# Patient Record
Sex: Female | Born: 1975 | Race: White | Hispanic: No | Marital: Single
Health system: Southern US, Community
[De-identification: ages and names within clinical notes are randomized; demographics above are authoritative.]

---

## 2001-03-24 ENCOUNTER — Other Ambulatory Visit: Admission: RE | Admit: 2001-03-24 | Discharge: 2001-03-24 | Payer: Self-pay | Admitting: Obstetrics and Gynecology

## 2002-04-01 ENCOUNTER — Other Ambulatory Visit: Admission: RE | Admit: 2002-04-01 | Discharge: 2002-04-01 | Payer: Self-pay | Admitting: Obstetrics and Gynecology

## 2003-02-01 ENCOUNTER — Inpatient Hospital Stay (HOSPITAL_COMMUNITY): Admission: RE | Admit: 2003-02-01 | Discharge: 2003-02-04 | Payer: Self-pay | Admitting: Obstetrics and Gynecology

## 2003-03-10 ENCOUNTER — Other Ambulatory Visit: Admission: RE | Admit: 2003-03-10 | Discharge: 2003-03-10 | Payer: Self-pay | Admitting: Obstetrics and Gynecology

## 2004-04-11 ENCOUNTER — Other Ambulatory Visit: Admission: RE | Admit: 2004-04-11 | Discharge: 2004-04-11 | Payer: Self-pay | Admitting: Obstetrics and Gynecology

## 2015-05-04 ENCOUNTER — Other Ambulatory Visit: Payer: Self-pay | Admitting: Family Medicine

## 2015-05-04 DIAGNOSIS — Z1239 Encounter for other screening for malignant neoplasm of breast: Secondary | ICD-10-CM

## 2015-06-08 ENCOUNTER — Ambulatory Visit (INDEPENDENT_AMBULATORY_CARE_PROVIDER_SITE_OTHER): Payer: Managed Care, Other (non HMO)

## 2015-06-08 DIAGNOSIS — Z1231 Encounter for screening mammogram for malignant neoplasm of breast: Secondary | ICD-10-CM | POA: Diagnosis not present

## 2015-06-08 DIAGNOSIS — Z1239 Encounter for other screening for malignant neoplasm of breast: Secondary | ICD-10-CM

## 2015-06-09 ENCOUNTER — Other Ambulatory Visit: Payer: Self-pay | Admitting: Family Medicine

## 2015-06-09 DIAGNOSIS — R928 Other abnormal and inconclusive findings on diagnostic imaging of breast: Secondary | ICD-10-CM

## 2015-06-13 ENCOUNTER — Telehealth: Payer: Self-pay

## 2015-06-14 ENCOUNTER — Other Ambulatory Visit: Payer: Self-pay | Admitting: Family Medicine

## 2015-06-14 DIAGNOSIS — R928 Other abnormal and inconclusive findings on diagnostic imaging of breast: Secondary | ICD-10-CM

## 2015-06-16 ENCOUNTER — Ambulatory Visit
Admission: RE | Admit: 2015-06-16 | Discharge: 2015-06-16 | Disposition: A | Discharge status: Home / Self Care | Payer: Managed Care, Other (non HMO) | Source: Ambulatory Visit | Attending: Family Medicine | Admitting: Family Medicine

## 2015-06-16 DIAGNOSIS — R928 Other abnormal and inconclusive findings on diagnostic imaging of breast: Secondary | ICD-10-CM

## 2016-08-16 NOTE — Telephone Encounter (Signed)
Error

## 2017-10-09 IMAGING — MG MM DIAG BREAST TOMO UNI RIGHT
6 of 10 series · 6 of 26 positions shown · non-contrast
Comparison: Baseline screening mammogram dated 06/08/2015.

CLINICAL DATA: Screening recall from baseline mammogram for a right
breast asymmetry.

EXAM:
2D DIGITAL DIAGNOSTIC UNILATERAL RIGHT MAMMOGRAM WITH CAD AND
ADJUNCT TOMO
RIGHT BREAST ULTRASOUND

[R MLO synth-2D]
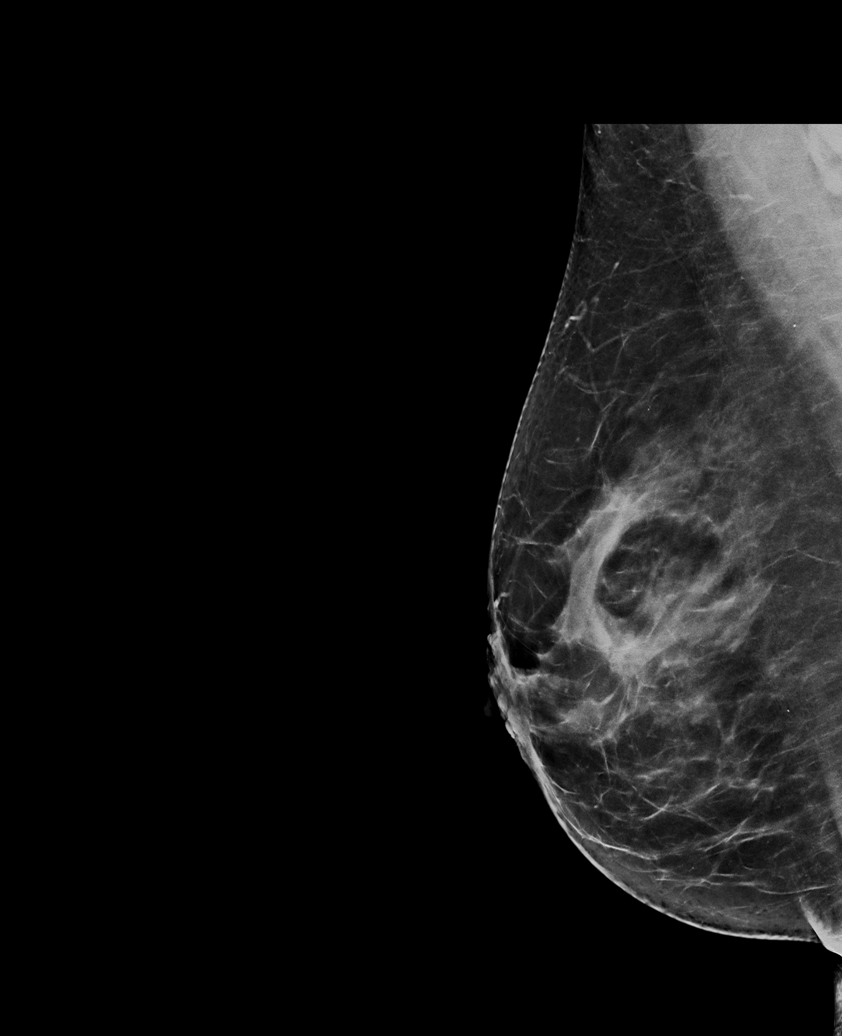

[R CC synth-2D]
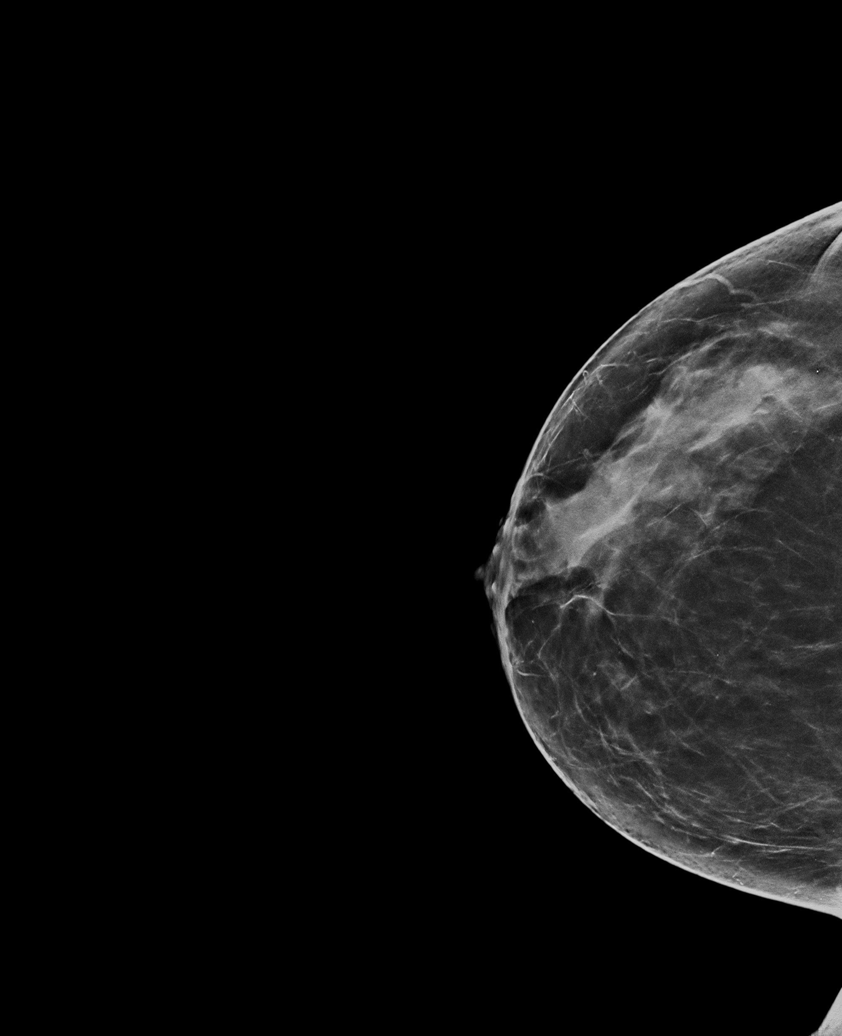

[R MLO (1 of 2)]
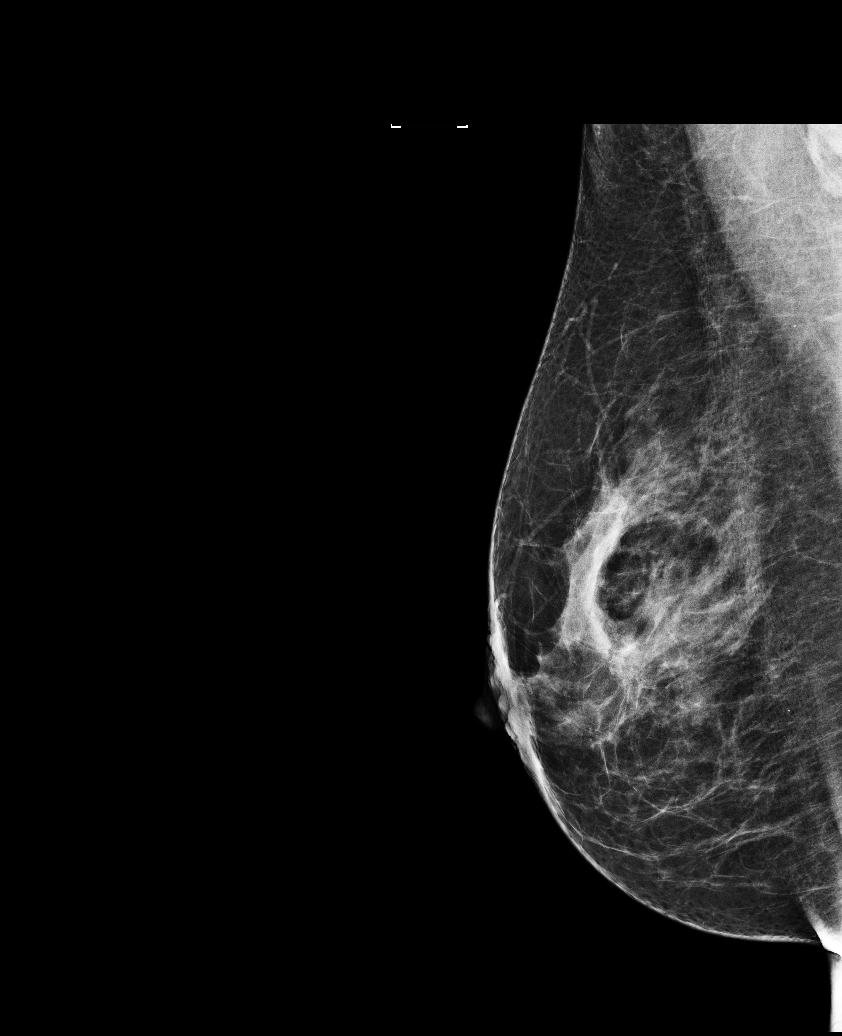

[R CC (1 of 2)]
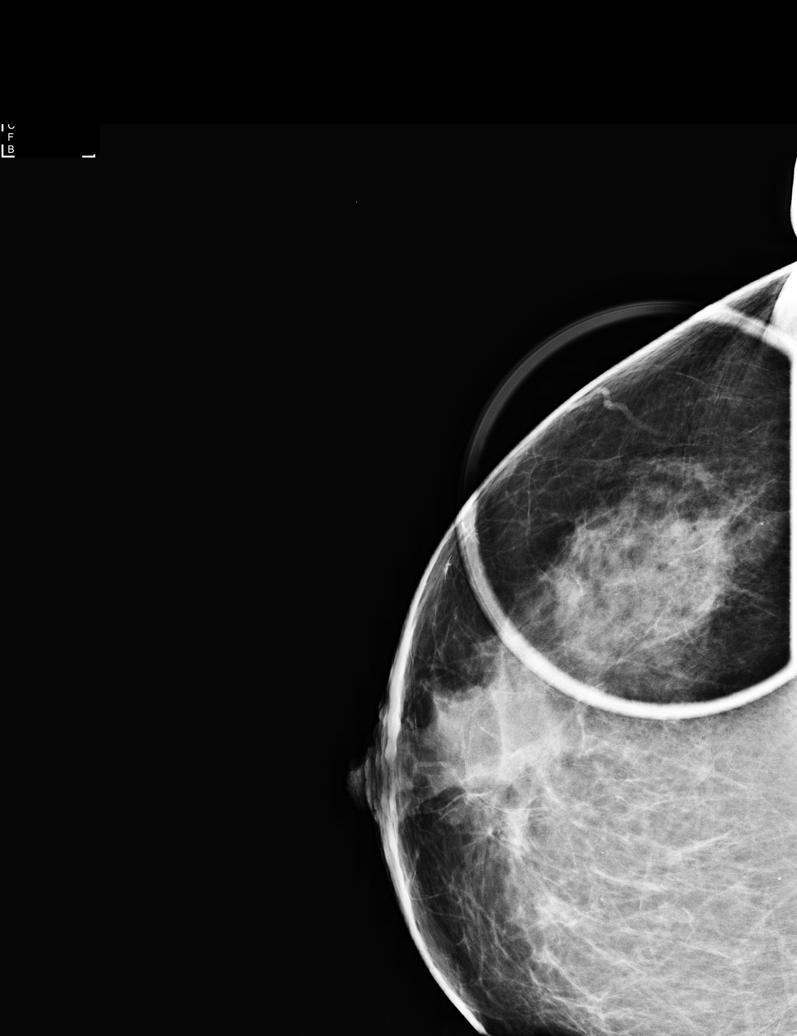

[R MLO (2 of 2)]
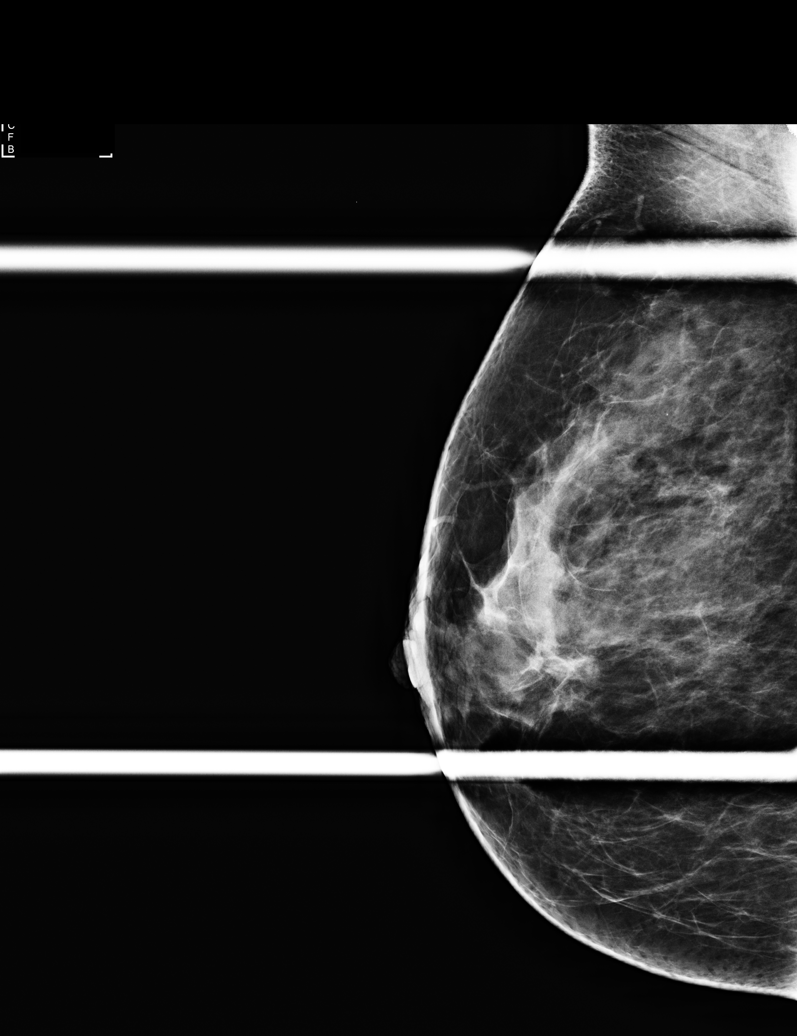

[R CC (2 of 2)]
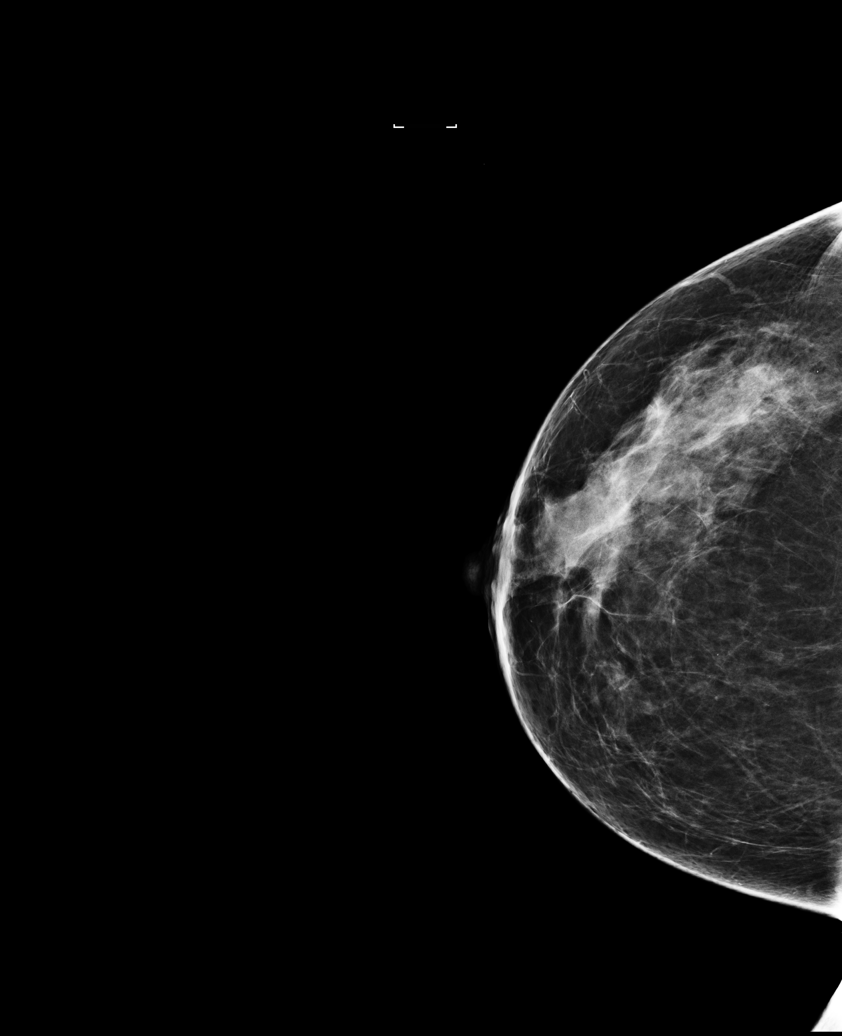

[6 of 26 positions shown; findings below may reference images not displayed]

ACR Breast Density Category c: The breast tissue is heterogeneously
dense, which may obscure small masses.
FINDINGS: Additional spot compression tomosynthesis images of the right breast
demonstrate no persistent asymmetry or underlying mass in the area
of concern identified on the screening mammogram. No suspicious
calcifications, masses or areas of distortion are seen in the right
breast. However, due to the density of the breast tissue in this
area, ultrasound will be performed for further evaluation.

Mammographic images were processed with CAD.

Physical exam of the upper-outer quadrant of the right breast
demonstrates no suspicious palpable masses.

Ultrasound of the lateral and upper outer right breast which was
scanned from the 8 o'clock to the 12 o'clock position demonstrates
no masses or suspicious areas of shadowing. Normal fibroglandular
tissue is identified.
IMPRESSION: 1. Interval resolution of the asymmetry of concern in the right
breast. This is consistent with overlapping fibroglandular tissue.

2.  No mammographic evidence of right breast malignancy.

RECOMMENDATION:
Screening mammogram in one year.(Code:[id number])

I have discussed the findings and recommendations with the patient.
Results were also provided in writing at the conclusion of the
visit. If applicable, a reminder letter will be sent to the patient
regarding the next appointment.

BI-RADS CATEGORY  1: Negative.

## 2020-05-27 DIAGNOSIS — Z1231 Encounter for screening mammogram for malignant neoplasm of breast: Secondary | ICD-10-CM | POA: Diagnosis not present

## 2020-05-27 DIAGNOSIS — Z7689 Persons encountering health services in other specified circumstances: Secondary | ICD-10-CM | POA: Diagnosis not present

## 2020-09-23 DIAGNOSIS — Z131 Encounter for screening for diabetes mellitus: Secondary | ICD-10-CM | POA: Diagnosis not present

## 2020-09-23 DIAGNOSIS — Z13220 Encounter for screening for lipoid disorders: Secondary | ICD-10-CM | POA: Diagnosis not present

## 2020-09-23 DIAGNOSIS — Z23 Encounter for immunization: Secondary | ICD-10-CM | POA: Diagnosis not present

## 2020-09-23 DIAGNOSIS — Z0000 Encounter for general adult medical examination without abnormal findings: Secondary | ICD-10-CM | POA: Diagnosis not present

## 2021-01-03 DIAGNOSIS — Z6833 Body mass index (BMI) 33.0-33.9, adult: Secondary | ICD-10-CM | POA: Diagnosis not present

## 2021-01-03 DIAGNOSIS — Z01419 Encounter for gynecological examination (general) (routine) without abnormal findings: Secondary | ICD-10-CM | POA: Diagnosis not present

## 2021-01-06 DIAGNOSIS — D2261 Melanocytic nevi of right upper limb, including shoulder: Secondary | ICD-10-CM | POA: Diagnosis not present

## 2021-01-06 DIAGNOSIS — D224 Melanocytic nevi of scalp and neck: Secondary | ICD-10-CM | POA: Diagnosis not present

## 2021-01-06 DIAGNOSIS — L821 Other seborrheic keratosis: Secondary | ICD-10-CM | POA: Diagnosis not present

## 2021-01-06 DIAGNOSIS — D2262 Melanocytic nevi of left upper limb, including shoulder: Secondary | ICD-10-CM | POA: Diagnosis not present

## 2021-03-21 DIAGNOSIS — Z1211 Encounter for screening for malignant neoplasm of colon: Secondary | ICD-10-CM | POA: Diagnosis not present

## 2021-03-21 DIAGNOSIS — D120 Benign neoplasm of cecum: Secondary | ICD-10-CM | POA: Diagnosis not present

## 2021-03-21 DIAGNOSIS — K648 Other hemorrhoids: Secondary | ICD-10-CM | POA: Diagnosis not present

## 2021-06-20 DIAGNOSIS — Z1231 Encounter for screening mammogram for malignant neoplasm of breast: Secondary | ICD-10-CM | POA: Diagnosis not present

## 2021-10-04 DIAGNOSIS — Z131 Encounter for screening for diabetes mellitus: Secondary | ICD-10-CM | POA: Diagnosis not present

## 2021-10-04 DIAGNOSIS — E785 Hyperlipidemia, unspecified: Secondary | ICD-10-CM | POA: Diagnosis not present

## 2021-10-04 DIAGNOSIS — Z0000 Encounter for general adult medical examination without abnormal findings: Secondary | ICD-10-CM | POA: Diagnosis not present

## 2022-03-27 DIAGNOSIS — Z01419 Encounter for gynecological examination (general) (routine) without abnormal findings: Secondary | ICD-10-CM | POA: Diagnosis not present

## 2022-03-27 DIAGNOSIS — Z124 Encounter for screening for malignant neoplasm of cervix: Secondary | ICD-10-CM | POA: Diagnosis not present

## 2022-03-27 DIAGNOSIS — Z1151 Encounter for screening for human papillomavirus (HPV): Secondary | ICD-10-CM | POA: Diagnosis not present

## 2022-08-30 DIAGNOSIS — Z1231 Encounter for screening mammogram for malignant neoplasm of breast: Secondary | ICD-10-CM | POA: Diagnosis not present

## 2022-10-08 DIAGNOSIS — Z131 Encounter for screening for diabetes mellitus: Secondary | ICD-10-CM | POA: Diagnosis not present

## 2022-10-08 DIAGNOSIS — E785 Hyperlipidemia, unspecified: Secondary | ICD-10-CM | POA: Diagnosis not present

## 2022-10-08 DIAGNOSIS — Z0000 Encounter for general adult medical examination without abnormal findings: Secondary | ICD-10-CM | POA: Diagnosis not present

## 2022-10-10 DIAGNOSIS — Z0000 Encounter for general adult medical examination without abnormal findings: Secondary | ICD-10-CM | POA: Diagnosis not present

## 2023-04-02 DIAGNOSIS — Z124 Encounter for screening for malignant neoplasm of cervix: Secondary | ICD-10-CM | POA: Diagnosis not present

## 2023-04-02 DIAGNOSIS — Z1151 Encounter for screening for human papillomavirus (HPV): Secondary | ICD-10-CM | POA: Diagnosis not present

## 2023-04-02 DIAGNOSIS — Z01419 Encounter for gynecological examination (general) (routine) without abnormal findings: Secondary | ICD-10-CM | POA: Diagnosis not present

## 2023-10-14 DIAGNOSIS — R519 Headache, unspecified: Secondary | ICD-10-CM | POA: Diagnosis not present

## 2023-10-14 DIAGNOSIS — E785 Hyperlipidemia, unspecified: Secondary | ICD-10-CM | POA: Diagnosis not present

## 2023-10-14 DIAGNOSIS — Z0000 Encounter for general adult medical examination without abnormal findings: Secondary | ICD-10-CM | POA: Diagnosis not present

## 2023-10-14 DIAGNOSIS — L719 Rosacea, unspecified: Secondary | ICD-10-CM | POA: Diagnosis not present

## 2023-10-14 DIAGNOSIS — Z6832 Body mass index (BMI) 32.0-32.9, adult: Secondary | ICD-10-CM | POA: Diagnosis not present

## 2023-10-14 DIAGNOSIS — R5383 Other fatigue: Secondary | ICD-10-CM | POA: Diagnosis not present

## 2023-10-14 DIAGNOSIS — Z131 Encounter for screening for diabetes mellitus: Secondary | ICD-10-CM | POA: Diagnosis not present

## 2023-10-14 DIAGNOSIS — F411 Generalized anxiety disorder: Secondary | ICD-10-CM | POA: Diagnosis not present

## 2023-11-05 DIAGNOSIS — Z1231 Encounter for screening mammogram for malignant neoplasm of breast: Secondary | ICD-10-CM | POA: Diagnosis not present
